# Patient Record
Sex: Female | Born: 2000 | Race: White | Hispanic: No | Marital: Single | Smoking: Never smoker
Health system: Southern US, Community
[De-identification: ages and names within clinical notes are randomized; demographics above are authoritative.]

## PROBLEM LIST (undated history)

## (undated) HISTORY — PX: OTHER SURGICAL HISTORY: SHX169

---
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
## 2022-01-19 HISTORY — PX: KNEE CARTILAGE SURGERY: SHX688

## 2022-06-11 ENCOUNTER — Ambulatory Visit: Payer: BC Managed Care – PPO | Admitting: Family Medicine

## 2022-06-11 ENCOUNTER — Encounter: Payer: Self-pay | Admitting: Family Medicine

## 2022-06-11 VITALS — BP 102/60 | HR 78 | Temp 98.0°F | Resp 18 | Ht 67.5 in | Wt 133.2 lb

## 2022-06-11 DIAGNOSIS — J060 Acute laryngopharyngitis: Secondary | ICD-10-CM

## 2022-06-11 DIAGNOSIS — J029 Acute pharyngitis, unspecified: Secondary | ICD-10-CM

## 2022-06-11 DIAGNOSIS — R509 Fever, unspecified: Secondary | ICD-10-CM | POA: Diagnosis not present

## 2022-06-11 LAB — POCT RAPID STREP A (OFFICE): Rapid Strep A Screen: NEGATIVE

## 2022-06-11 MED ORDER — AZITHROMYCIN 250 MG PO TABS: ORAL_TABLET | ORAL | 0 refills | Status: DC

## 2022-06-11 NOTE — Progress Notes (Signed)
Acute Office Visit  Subjective:    Patient ID: Teresa Humphrey, female    DOB: Nov 03, 2001, 21 y.o.   MRN: [id number]  Chief Complaint  Patient presents with   Sore Throat   Generalized Body Aches    HPI: Patient is in today for fever (low 100s) and sore throat x 3-4 days. No nasal congestion or cough.   History reviewed. No pertinent past medical history.  Past Surgical History:  Procedure Laterality Date   none      History reviewed. No pertinent family history.  Social History   Socioeconomic History   Marital status: Single    Spouse name: Not on file   Number of children: Not on file   Years of education: Not on file   Highest education level: Not on file  Occupational History   Occupation: student  Tobacco Use   Smoking status: Never   Smokeless tobacco: Never  Vaping Use   Vaping Use: Never used  Substance and Sexual Activity   Alcohol use: Never   Drug use: Never   Sexual activity: Not on file  Other Topics Concern   Not on file  Social History Narrative   Not on file   Social Determinants of Health   Financial Resource Strain: Not on file  Food Insecurity: Not on file  Transportation Needs: Not on file  Physical Activity: Not on file  Stress: Not on file  Social Connections: Not on file  Intimate Partner Violence: Not on file    No outpatient medications prior to visit.   No facility-administered medications prior to visit.    No Known Allergies  Review of Systems  Constitutional:  Positive for fatigue and fever.  HENT:  Positive for sore throat.   Neurological:  Negative for headaches.       Objective:    Physical Exam Vitals reviewed.  Constitutional:      Appearance: Normal appearance. She is well-developed.  HENT:     Right Ear: Tympanic membrane, ear canal and external ear normal.     Left Ear: Tympanic membrane, ear canal and external ear normal.     Nose: Nose normal.     Mouth/Throat:     Pharynx: Posterior  oropharyngeal erythema present. No oropharyngeal exudate.  Cardiovascular:     Rate and Rhythm: Normal rate and regular rhythm.     Heart sounds: Normal heart sounds. No murmur heard. Pulmonary:     Effort: Pulmonary effort is normal. No respiratory distress.     Breath sounds: Normal breath sounds.  Lymphadenopathy:     Cervical: Cervical adenopathy present.  Neurological:     Mental Status: She is alert and oriented to person, place, and time.  Psychiatric:        Mood and Affect: Mood normal.        Behavior: Behavior normal.     BP 102/60   Pulse 78   Temp 98 F (36.7 C)   Resp 18   Ht 5' 7.5" (1.715 m)   Wt 133 lb 3.2 oz (60.4 kg)   BMI 20.55 kg/m  Wt Readings from Last 3 Encounters:  06/11/22 133 lb 3.2 oz (60.4 kg) (60 %, Z= 0.26)*  04/25/21 129 lb (58.5 kg) (58 %, Z= 0.21)*  12/08/20 126 lb 9.6 oz (57.4 kg) (55 %, Z= 0.14)*   * Growth percentiles are based on CDC (Girls, 2-20 Years) data.    Health Maintenance Due  Topic Date Due   HPV VACCINES (1 -  2-dose series) Never done   COVID-19 Vaccine (3 - Pfizer series) 06/15/2020       Topic Date Due   HPV VACCINES (1 - 2-dose series) Never done     Lab Results  Component Value Date   TSH 0.825 12/08/2020   Lab Results  Component Value Date   WBC 11.3 (H) 12/08/2020   HGB 14.0 12/08/2020   HCT 41.0 12/08/2020   MCV 87 12/08/2020   PLT 300 12/08/2020   Lab Results  Component Value Date   NA 139 12/08/2020   K 4.9 12/08/2020   CO2 25 12/08/2020   GLUCOSE 87 12/08/2020   BUN 13 12/08/2020   CREATININE 0.84 12/08/2020   BILITOT <0.2 12/08/2020   ALKPHOS 73 12/08/2020   AST 19 12/08/2020   ALT 16 12/08/2020   PROT 7.4 12/08/2020   ALBUMIN 4.7 12/08/2020   CALCIUM 9.9 12/08/2020   ANIONGAP 11 09/27/2020   No results found for: "CHOL" No results found for: "HDL" No results found for: "LDLCALC" No results found for: "TRIG" No results found for: "CHOLHDL" No results found for: "HGBA1C"      Assessment & Plan:   Problem List Items Addressed This Visit       Respiratory   Acute laryngopharyngitis    Zpack.  Gargle warm salt water.  Check for mono.         Other   Sore throat - Primary   Relevant Orders   POCT rapid strep A (Completed)   Mononucleosis screen   Fever   Relevant Orders   POCT rapid strep A (Completed)   Meds ordered this encounter  Medications   azithromycin (ZITHROMAX) 250 MG tablet    Sig: 2 DAILY FOR FIRST DAY, THEN DECREASE TO ONE DAILY FOR 4 MORE DAYS.    Dispense:  6 tablet    Refill:  0    Orders Placed This Encounter  Procedures   Mononucleosis screen   POCT rapid strep A     Follow-up: No follow-ups on file.  An After Visit Summary was printed and given to the patient.  Blane Ohara, MD [facility] [phone]

## 2022-06-11 NOTE — Assessment & Plan Note (Signed)
Zpack.  Gargle warm salt water.  Check for mono.

## 2022-06-12 LAB — MONONUCLEOSIS SCREEN: Mono Screen: NEGATIVE

## 2022-06-15 ENCOUNTER — Telehealth: Payer: Self-pay

## 2022-06-15 ENCOUNTER — Other Ambulatory Visit: Payer: Self-pay | Admitting: Family Medicine

## 2022-06-15 MED ORDER — AMOXICILLIN-POT CLAVULANATE 875-125 MG PO TABS: 1.0000 | ORAL_TABLET | Freq: Two times a day (BID) | ORAL | 0 refills | Status: DC

## 2022-06-15 NOTE — Telephone Encounter (Signed)
Mother made aware.   Lorita Officer, CCMA 06/15/22 10:41 AM

## 2022-06-15 NOTE — Telephone Encounter (Signed)
Mother calling as patient was seen on Monday. She was treated for sore throat. Mother is concerned as today is last day of zpack and patient's throat is not better and still running low grade fever. Patient has been doing salt water gargles "some" and taking tylenol for fever. Fever has been 99-100 when tylenol wears off. Mother is questioning if she needs more antibiotic. Patient is scheduled to have wisdom teeth removed on Wednesday.  Please advise.  Steward Drone: [phone] Lorita Officer, CCMA 06/15/22 8:09 AM

## 2022-06-18 ENCOUNTER — Telehealth: Payer: Self-pay

## 2022-06-18 ENCOUNTER — Other Ambulatory Visit: Payer: Self-pay | Admitting: Family Medicine

## 2022-06-18 DIAGNOSIS — Z130 Encounter for screening for diseases of the blood and blood-forming organs and certain disorders involving the immune mechanism: Secondary | ICD-10-CM

## 2022-06-18 NOTE — Telephone Encounter (Signed)
Steward Drone called to report that Soma needs a sickle cell test to meet [employer] requirements for her physical.  Dr. Sedalia Muta approved that order and she is coming in tomorrow afternoon.

## 2022-06-19 ENCOUNTER — Other Ambulatory Visit: Payer: Self-pay | Admitting: Family Medicine

## 2022-06-19 ENCOUNTER — Ambulatory Visit: Payer: BC Managed Care – PPO

## 2022-06-19 DIAGNOSIS — Z130 Encounter for screening for diseases of the blood and blood-forming organs and certain disorders involving the immune mechanism: Secondary | ICD-10-CM

## 2022-06-20 LAB — HGB SOLU + RFLX FRAC: Sickle Solubility Test - HGBRFX: NEGATIVE

## 2022-10-31 IMAGING — US US ABDOMEN LIMITED
1 series · 10 of 10 positions shown · non-contrast
Comparison: None.

CLINICAL DATA: Generalized lower abdominal pain.

EXAM:
ULTRASOUND ABDOMEN LIMITED
TECHNIQUE: Gray scale imaging of the right lower quadrant was performed to
evaluate for suspected appendicitis. Standard imaging planes and
graded compression technique were utilized.

[Series 1: us appendix (abdomen limited) · 10 acquisitions, 10 frames shown]
[im 1/10]
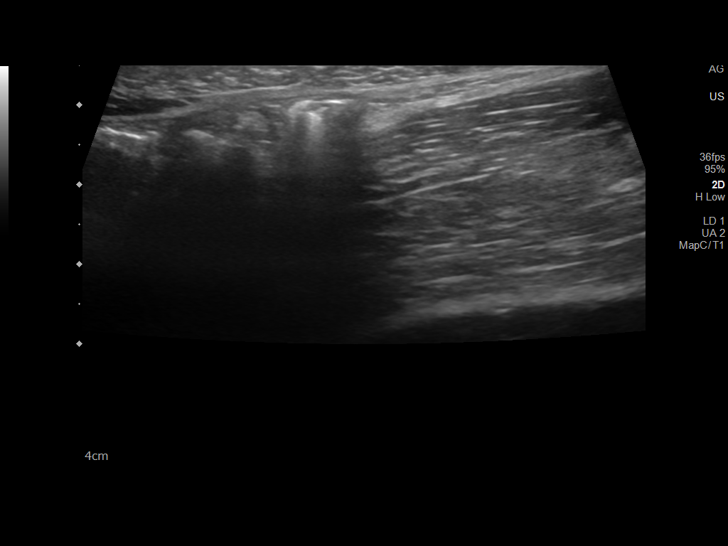
[im 2/10]
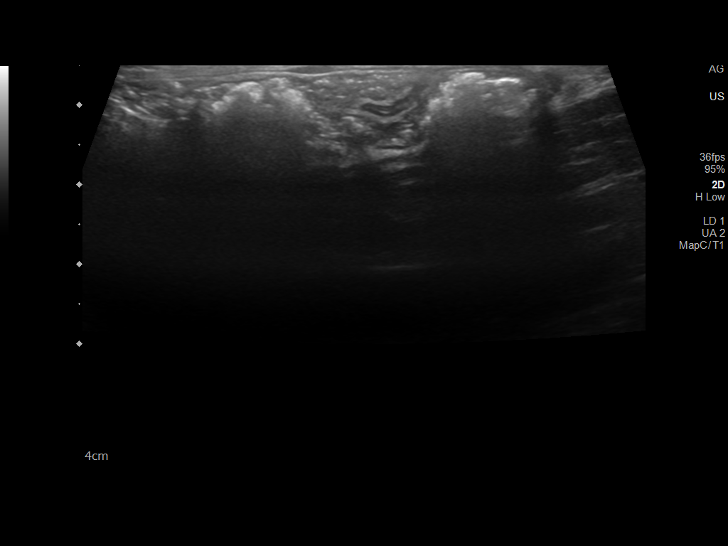
[im 3/10]
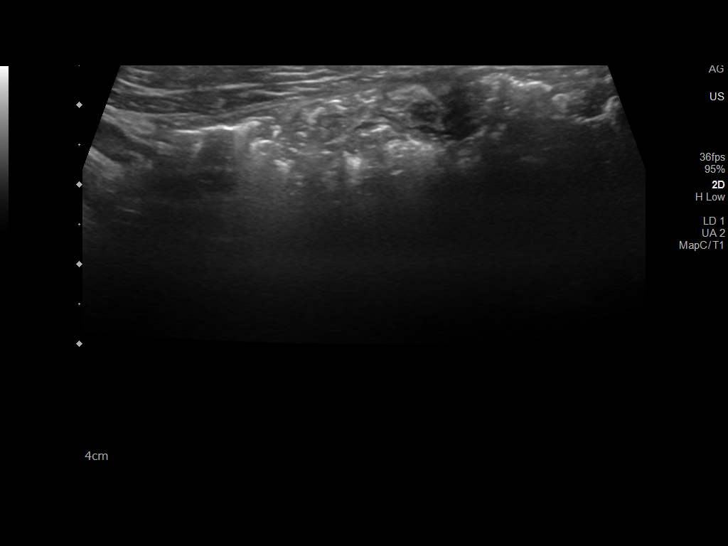
[im 4/10]
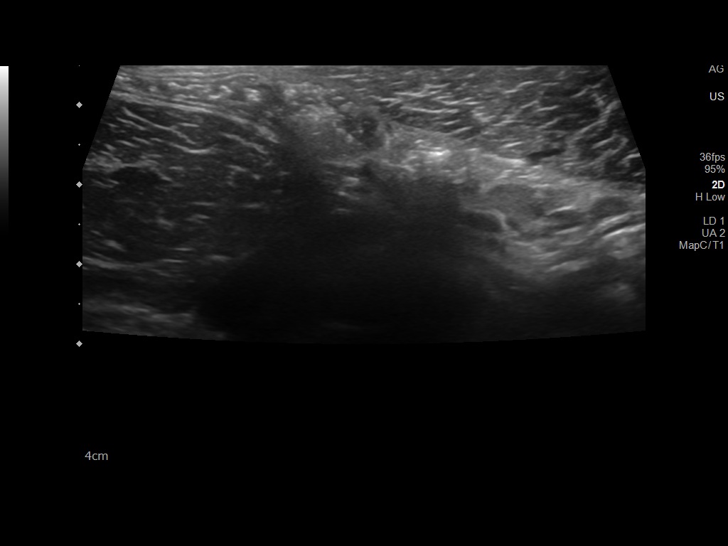
[im 5/10]
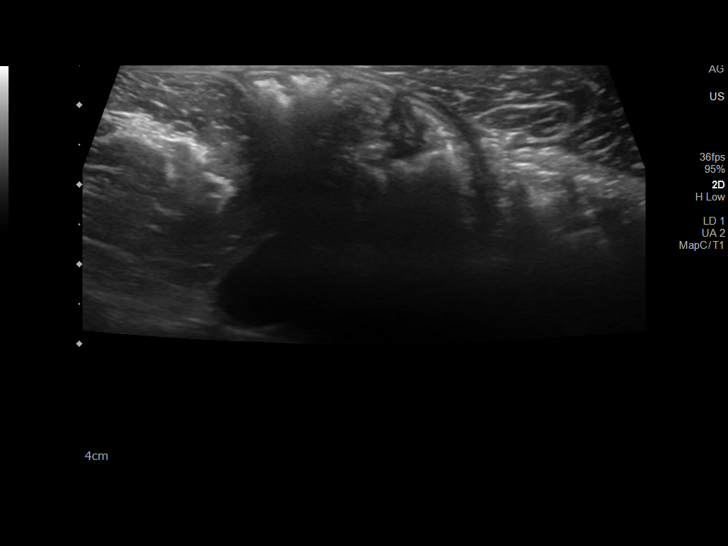
[im 6/10]
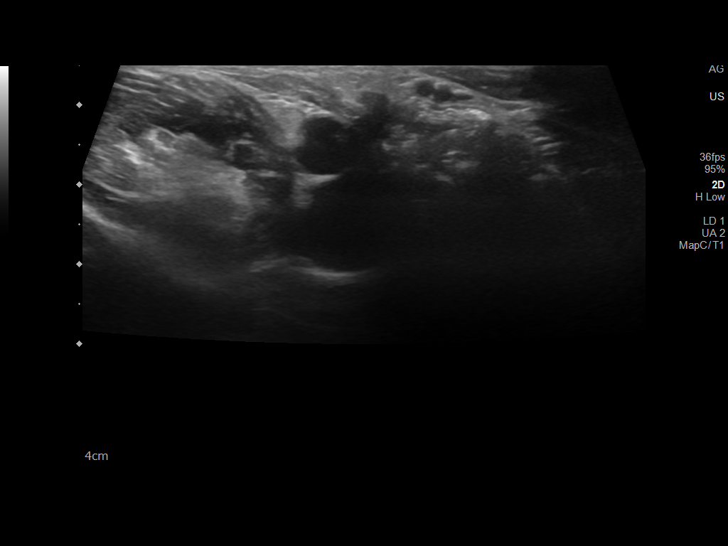
[im 7/10]
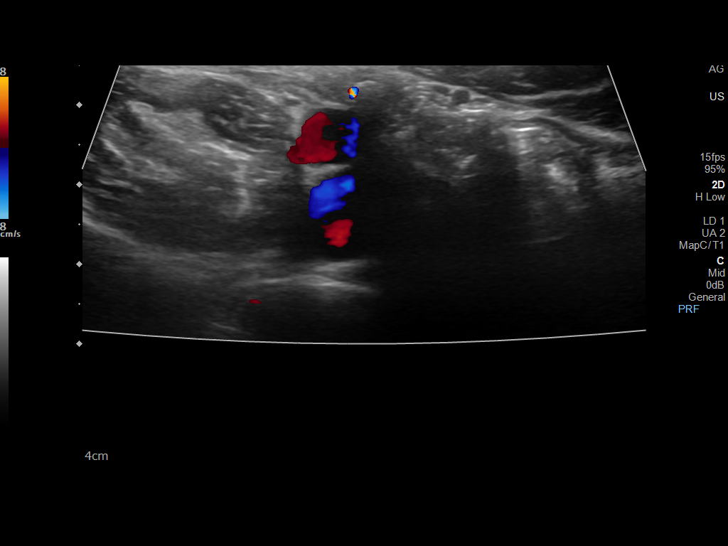
[im 8/10]
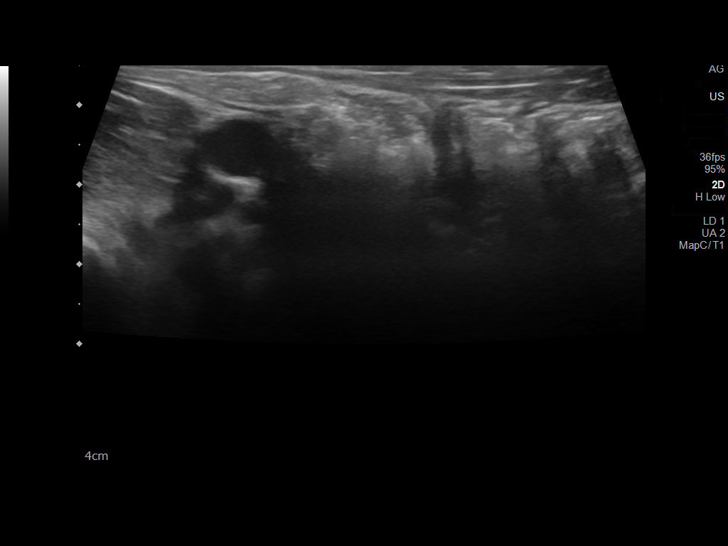
[im 9/10]
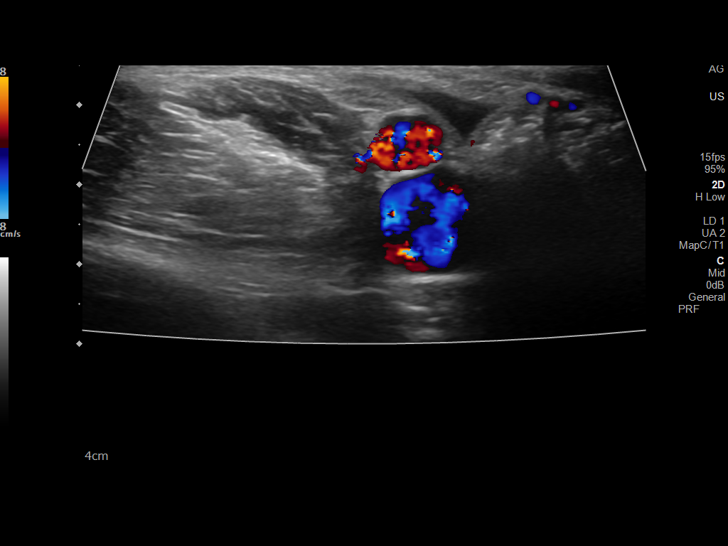
[im 10/10]
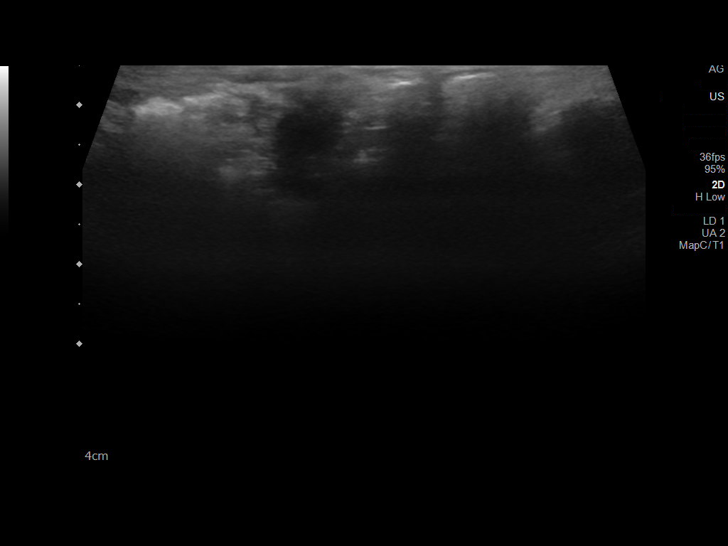

[10 of 10 positions shown; findings below may reference images not displayed]

FINDINGS: The appendix is not visualized.

Ancillary findings: None.

Factors affecting image quality: None.

Other findings: Small amount of free fluid in the pelvis.
IMPRESSION: Appendix not visualized.

Small amount of free fluid in the right pelvis

## 2022-10-31 IMAGING — US US ART/VEN ABD/PELV/SCROTUM DOPPLER LTD
1 series · 14 of 25 positions shown · non-contrast
Comparison: None.

CLINICAL DATA: 19-year-old female with right lower quadrant
abdominal pain.

EXAM:
TRANSABDOMINAL ULTRASOUND OF PELVIS
DOPPLER ULTRASOUND OF OVARIES
TECHNIQUE: Transabdominal ultrasound examination of the pelvis was performed
including evaluation of the uterus, ovaries, adnexal regions, and
pelvic cul-de-sac.
Color and duplex Doppler ultrasound was utilized to evaluate blood
flow to the ovaries.

[Series 1: us pelvic doppler (torsion right/o or mass arteria · arterial · 14 of 38 slices shown]
[im 1/38]
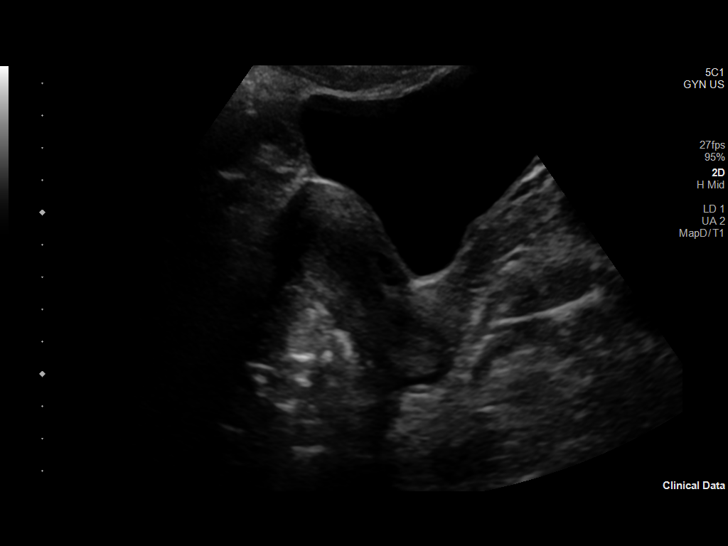
[im 4/38]
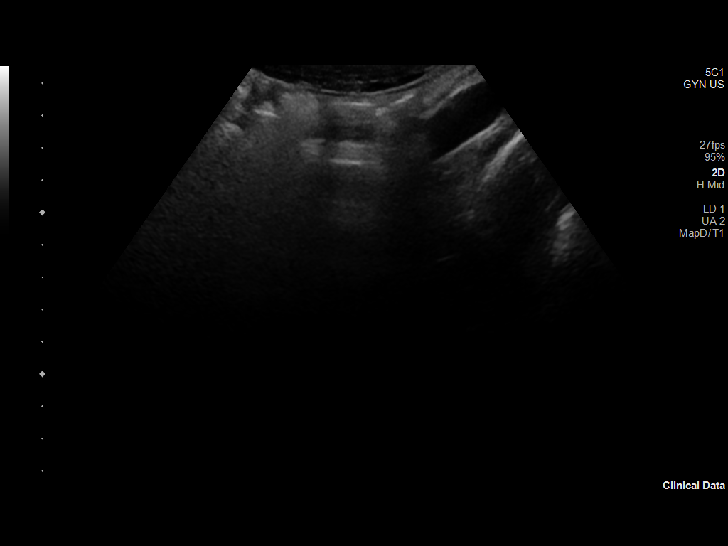
[im 7/38]
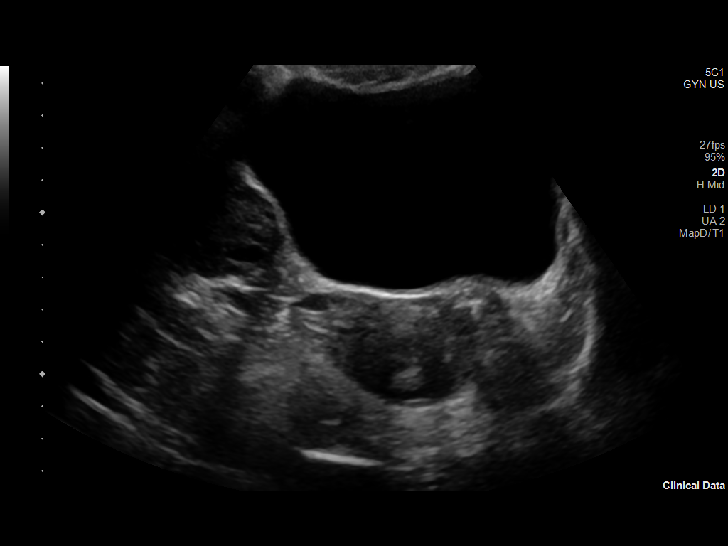
[im 10/38]
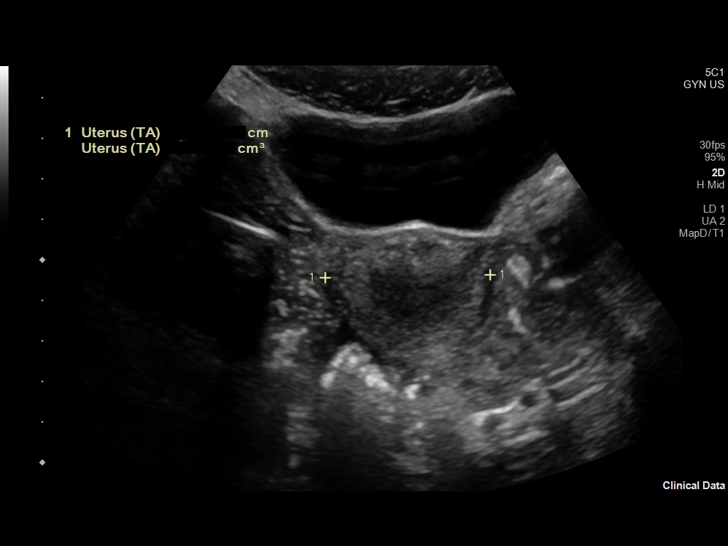
[im 13/38]
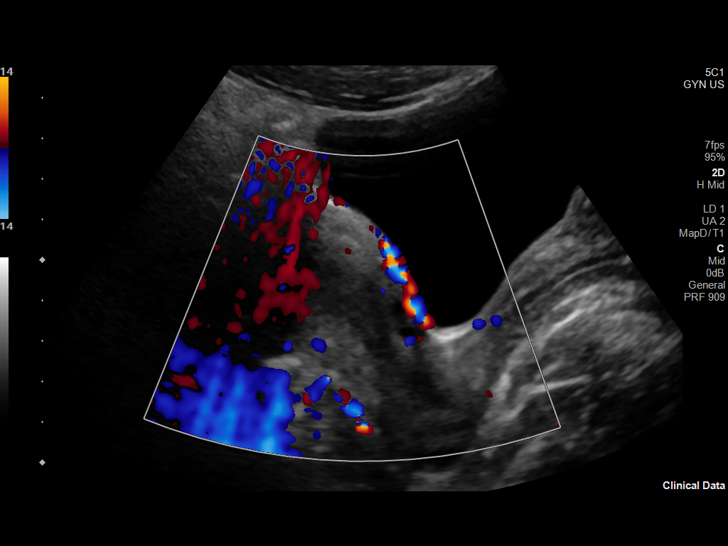
[im 14/38]
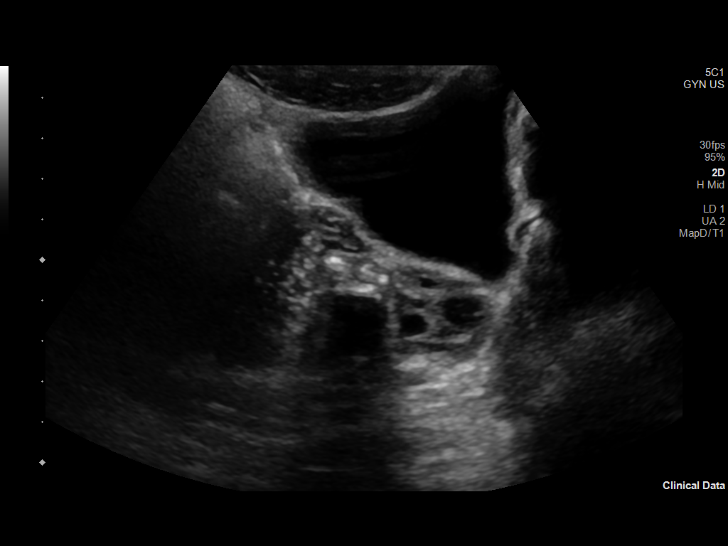
[im 17/38]
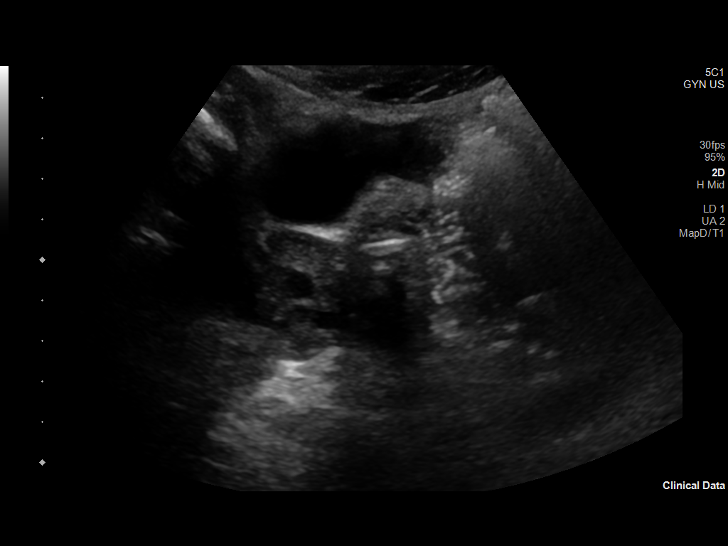
[im 21/38]
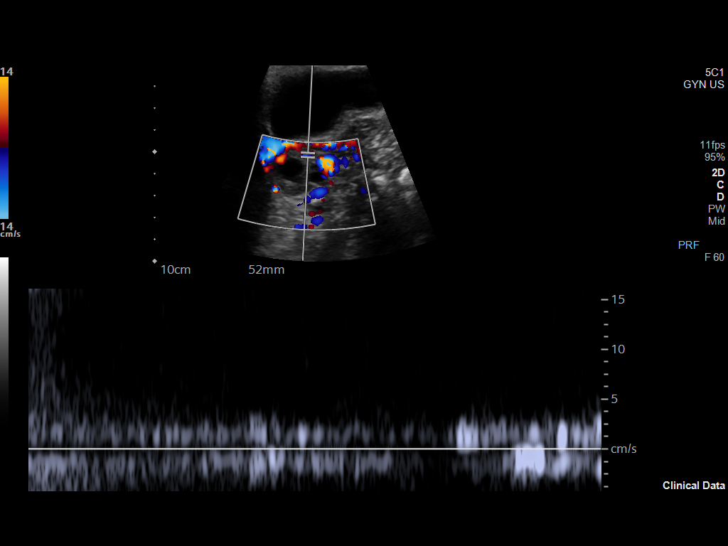
[im 24/38]
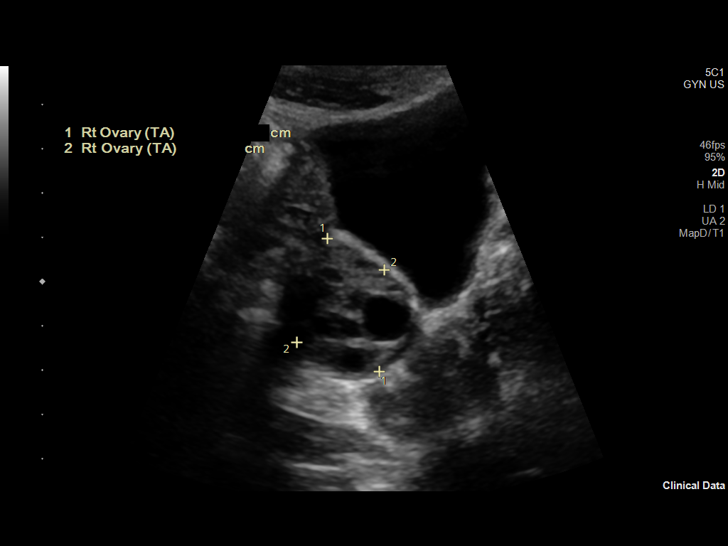
[im 25/38]
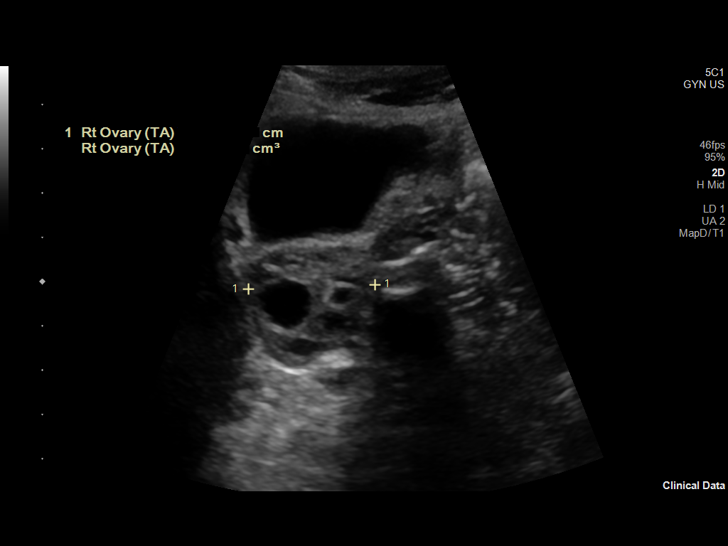
[im 28/38]
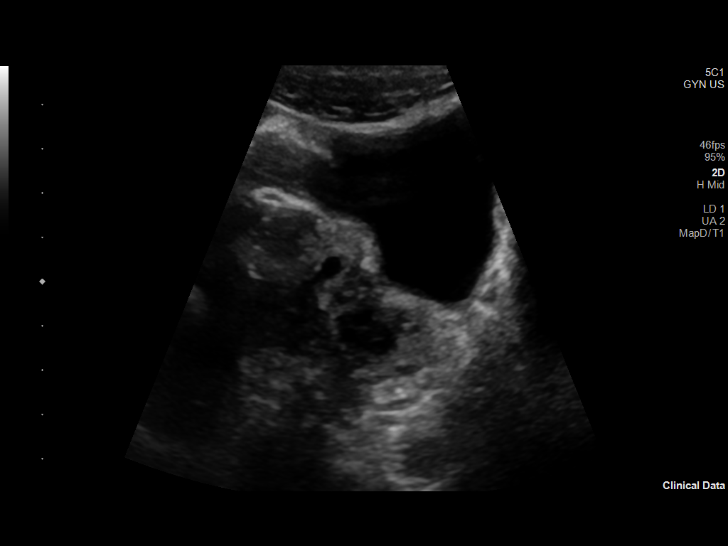
[im 31/38]
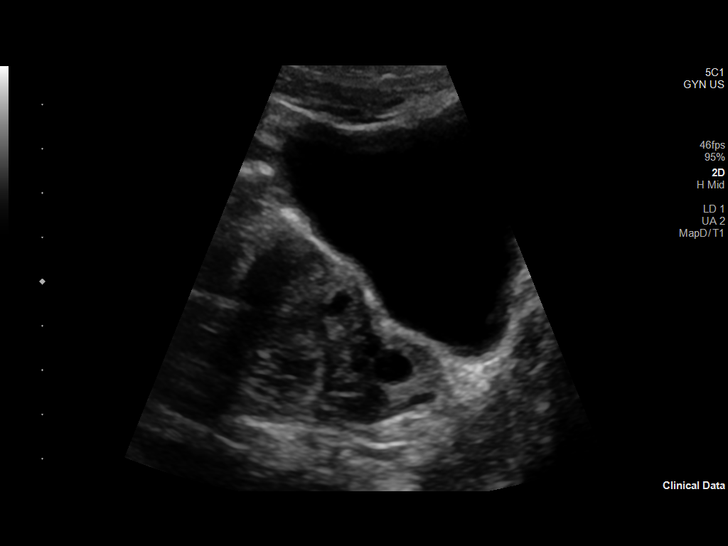
[im 34/38]
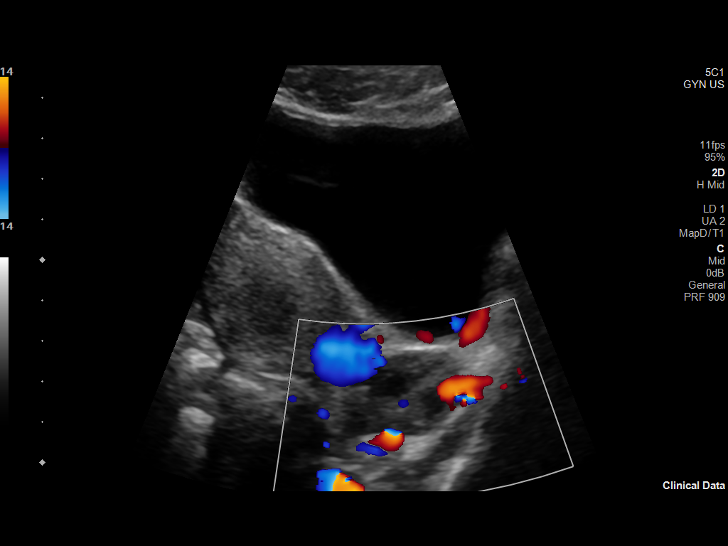
[im 38/38]
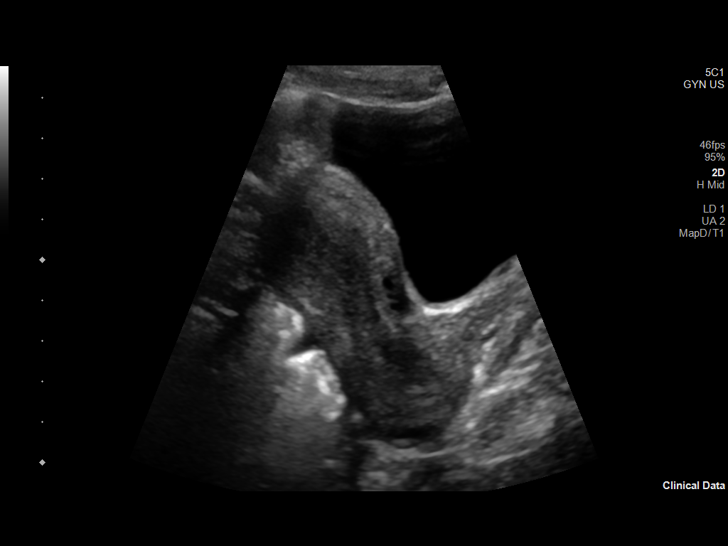

[14 of 25 positions shown; findings below may reference images not displayed]

FINDINGS: Uterus

Measurements: 7.4 x 3.3 x 4.1 cm = volume: 5.2 mL. No fibroids or
other mass visualized.

Endometrium

Thickness: 4 mm.  No focal abnormality visualized.

Right ovary

Measurements: 3.2 x 2.6 x 2.9 cm = volume: 12.4 mL. Normal
appearance/no adnexal mass.

Left ovary

Measurements: 3.1 x 2.3 x 2.3 cm = volume: 8.2 mL. Normal
appearance/no adnexal mass.

Pulsed Doppler evaluation demonstrates normal low-resistance
arterial and venous waveforms in both ovaries.

Other: None
IMPRESSION: Unremarkable pelvic ultrasound.

## 2023-01-21 ENCOUNTER — Ambulatory Visit: Payer: BC Managed Care – PPO | Admitting: Physician Assistant

## 2023-01-22 ENCOUNTER — Encounter: Payer: BC Managed Care – PPO | Admitting: Physician Assistant

## 2023-04-15 ENCOUNTER — Telehealth: Payer: Self-pay

## 2023-04-15 NOTE — Telephone Encounter (Signed)
PATIENT'S MOTHER CALLED STATING THAT THE PATIENT HAS BEEN RUNNING A LOW GRADE FEVER IN THE EVENINGS FOR A WEEK AND THEN ALSO FEELING ACHY ALL OVER BUT ONLY DURING THE EVENINGS. CONSULTED WITH PATIENT'S PROVIDER SARA DAVIS AND PER PROVIDER PATIENT WOULD NEED TO GO TO URGENT CARE BECAUSE SHE HAS NO OPENINGS THE WHOLE WEEK. PATIENT'S MOTHER INFORMED.

## 2023-05-23 ENCOUNTER — Telehealth: Payer: Self-pay

## 2023-05-23 NOTE — Telephone Encounter (Signed)
LVM for patient to call back [phone], or to call PCP office to schedule physical apt. AS, CMA  

## 2024-01-20 ENCOUNTER — Ambulatory Visit (INDEPENDENT_AMBULATORY_CARE_PROVIDER_SITE_OTHER): Payer: Self-pay | Admitting: Family Medicine

## 2024-01-20 ENCOUNTER — Encounter: Payer: Self-pay | Admitting: Family Medicine

## 2024-01-20 VITALS — BP 100/62 | HR 64 | Temp 98.3°F | Resp 16 | Ht 69.0 in | Wt 141.0 lb

## 2024-01-20 DIAGNOSIS — Z0001 Encounter for general adult medical examination with abnormal findings: Secondary | ICD-10-CM

## 2024-01-20 DIAGNOSIS — G43009 Migraine without aura, not intractable, without status migrainosus: Secondary | ICD-10-CM | POA: Insufficient documentation

## 2024-01-20 DIAGNOSIS — F4323 Adjustment disorder with mixed anxiety and depressed mood: Secondary | ICD-10-CM | POA: Insufficient documentation

## 2024-01-20 DIAGNOSIS — S83262A Peripheral tear of lateral meniscus, current injury, left knee, initial encounter: Secondary | ICD-10-CM | POA: Insufficient documentation

## 2024-01-20 MED ORDER — NURTEC 75 MG PO TBDP: ORAL_TABLET | ORAL | 2 refills | Status: DC

## 2024-01-20 NOTE — Progress Notes (Signed)
 Subjective:  Patient ID: Teresa Humphrey, female    DOB: 10-17-2001  Age: 23 y.o. MRN: [id number]  Chief Complaint  Patient presents with   Annual Exam    Discussed the use of AI scribe software for clinical note transcription with the patient, who gave verbal consent to proceed.   Well Adult Physical: Patient here for a comprehensive physical exam.The patient reports problems - Migraines  Patient states she gets migraines two or three times a month. She stated they last anywhere from 12 hours to a week.  Do you take any herbs or supplements that were not prescribed by a doctor? no Are you taking calcium supplements? no Are you taking aspirin daily? no  Encounter for general adult medical examination without abnormal findings  Physical ("At Risk" items are starred): Patient's last physical exam was 1 year ago .  Patient is not afflicted from Stress Incontinence and Urge Incontinence  Patient wears a seat belts Patient has smoke detectors and has carbon monoxide detectors. Patient practices appropriate gun safety. Patient wears sunscreen with extended sun exposure. Dental Care: biannual cleanings, brushes and flosses daily. Ophthalmology/Optometry: Annual visit.  Hearing loss: none Vision impairments: none  Menarche: 15 Menstrual History: normal LMP: 01/01/2024 Pregnancy history: 0 Safe at home: yes Self breast exams: no  Discussed the use of AI scribe software for clinical note transcription with the patient, who gave verbal consent to proceed.   HPI: The patient presents with headaches.  She experiences headaches primarily on the left side, behind her eye, which have worsened since starting college. The headaches typically begin in the morning and intensify by 2 PM, occurring four to five times a month and sometimes lasting up to a week. The frequency has increased from once a month last year. She describes a sensation of tightness behind her eyes as a precursor to the  headaches and notes sensitivity to light and noise during these episodes. No numbness or tingling is associated with the headaches.  She has tried ibuprofen and Excedrin for relief, but finds Excedrin's caffeine content disruptive to her sleep.    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 Physical activity does not typically worsen her headaches. She maintains hydration and monitors her diet to manage symptoms.  She has not had blood work since January 2022 and is interested in having it done to rule out issues like blood sugar imbalances. She fasted prior to the visit in preparation for potential lab work.     01/20/2024   10:03 AM 04/25/2021    2:09 PM  Depression screen PHQ 2/9  Decreased Interest 0 0  Down, Depressed, Hopeless 0 0  PHQ - 2 Score 0 0  Altered sleeping 2   Tired, decreased energy 2   Change in appetite 0   Feeling bad or failure about yourself  0   Trouble concentrating 3   Moving slowly or fidgety/restless 0   Suicidal thoughts 0   PHQ-9 Score 7   Difficult doing work/chores Very difficult          01/20/2024   10:03 AM  Fall Risk  Falls in the past year? 0  Was there an injury with Fall? 0  Fall Risk Category Calculator 0  Patient at Risk for Falls Due to No Fall Risks  Fall risk Follow up Falls evaluation completed        Social Hx   Social History   Socioeconomic History  Marital status: Single    Spouse name: Not on file   Number of children: Not on file   Years of education: Not on file   Highest education level: Not on file  Occupational History   Occupation: student  Tobacco Use   Smoking status: Never   Smokeless tobacco: Never  Vaping Use   Vaping status: Never Used  Substance and Sexual Activity   Alcohol use: Never   Drug use: Never   Sexual activity: Not on file  Other Topics Concern   Not on file  Social History Narrative   Not on file   Social Drivers of Health    Financial Resource Strain: Not on file  Food Insecurity: Not on file  Transportation Needs: Not on file  Physical Activity: Not on file  Stress: Not on file  Social Connections: Not on file   History reviewed. No pertinent past medical history. Past Surgical History:  Procedure Laterality Date   KNEE CARTILAGE SURGERY  01/19/2022   none      History reviewed. No pertinent family history.  Review of Systems  Constitutional:  Positive for fatigue. Negative for chills, diaphoresis and fever.  HENT:  Negative for congestion, ear pain, sinus pressure, sinus pain and sneezing.   Eyes: Negative.   Respiratory:  Negative for cough, chest tightness, shortness of breath and wheezing.   Cardiovascular:  Negative for chest pain and palpitations.  Gastrointestinal:  Negative for abdominal pain, constipation, diarrhea, nausea and vomiting.  Endocrine: Negative.   Genitourinary:  Negative for dysuria.  Musculoskeletal:  Negative for arthralgias.  Skin: Negative.   Neurological:  Positive for dizziness, light-headedness and headaches. Negative for weakness.  Hematological: Negative.   Psychiatric/Behavioral:  Negative for dysphoric mood. The patient is not nervous/anxious.      Objective:  BP 100/62   Pulse 64   Temp 98.3 F (36.8 C) (Oral)   Resp 16   Ht 5\' 9"  (1.753 m)   Wt 141 lb (64 kg)   LMP 01/01/2024   SpO2 99%   BMI 20.82 kg/m      01/20/2024    9:54 AM 06/11/2022   11:10 AM 04/25/2021    2:08 PM  BP/Weight  Systolic BP 100 102 108  Diastolic BP 62 60 66  Wt. (Lbs) 141 133.2 129  BMI 20.82 kg/m2 20.55 kg/m2 19.47 kg/m2    Physical Exam Vitals reviewed.  Constitutional:      General: She is not in acute distress.    Appearance: Normal appearance. She is well-groomed and normal weight. She is not ill-appearing.  HENT:     Head: Normocephalic.     Right Ear: Tympanic membrane, ear canal and external ear normal.     Left Ear: Tympanic membrane, ear canal and  external ear normal.     Nose: Nose normal.     Mouth/Throat:     Mouth: Mucous membranes are moist.     Pharynx: Oropharynx is clear. No posterior oropharyngeal erythema.  Eyes:     Extraocular Movements: Extraocular movements intact.     Conjunctiva/sclera: Conjunctivae normal.     Pupils: Pupils are equal, round, and reactive to light.  Cardiovascular:     Rate and Rhythm: Normal rate and regular rhythm.     Pulses: Normal pulses.     Heart sounds: Normal heart sounds. No murmur heard. Pulmonary:     Effort: Pulmonary effort is normal.     Breath sounds: Normal breath sounds. No wheezing.  Abdominal:  General: Bowel sounds are normal.     Palpations: Abdomen is soft.     Tenderness: There is no abdominal tenderness.  Musculoskeletal:        General: Normal range of motion.     Cervical back: Normal range of motion and neck supple.  Lymphadenopathy:     Cervical: No cervical adenopathy.  Skin:    General: Skin is warm and dry.  Neurological:     Mental Status: She is alert and oriented to person, place, and time. Mental status is at baseline.     Cranial Nerves: Cranial nerves 2-12 are intact.     Sensory: Sensation is intact.     Motor: No weakness.     Coordination: Coordination is intact.     Gait: Gait is intact.     Deep Tendon Reflexes: Reflexes are normal and symmetric.  Psychiatric:        Attention and Perception: Attention and perception normal.        Mood and Affect: Mood normal.        Speech: Speech normal.        Behavior: Behavior normal.        Thought Content: Thought content normal.        Judgment: Judgment normal.     Lab Results  Component Value Date   WBC 11.3 (H) 12/08/2020   HGB 14.0 12/08/2020   HCT 41.0 12/08/2020   PLT 300 12/08/2020   GLUCOSE 87 12/08/2020   ALT 16 12/08/2020   AST 19 12/08/2020   NA 139 12/08/2020   K 4.9 12/08/2020   CL 103 12/08/2020   CREATININE 0.84 12/08/2020   BUN 13 12/08/2020   CO2 25 12/08/2020    TSH 0.825 12/08/2020      Assessment & Plan:   Encounter for routine adult health examination with abnormal findings -     CBC with Differential/Platelet -     CMP14+EGFR -     Lipid panel -     TSH  Migraine without aura and without status migrainosus, not intractable Assessment & Plan: Increasing frequency of migraines, occurring 4-5 times per month, predominantly on the left side behind the eye. No associated numbness or tingling. Sensitivity to light and noise during episodes. Previous trial of ibuprofen and Excedrin with limited success. -Provide samples of Ubrelvy and Nurtec for trial. -Order blood work to rule out other potential causes of headaches. -Follow-up in a few days with lab results and to assess response to new medications.  Orders: -     Nurtec; One 75 mg tablet in 24 hours as needed for migraine  Dispense: 8 tablet; Refill: 2     Body mass index is 20.82 kg/m.   These are the goals we discussed:  Goals      Have 3 meals a day     - Continue to eat a healthy diet and exercise          This is a list of the screening recommended for you and due dates:  Health Maintenance  Topic Date Due   HPV Vaccine (1 - 3-dose series) Never done   Pap Smear  Never done   COVID-19 Vaccine (3 - 2024-25 season) 10/26/2024*   DTaP/Tdap/Td vaccine (2 - Td or Tdap) 01/16/2025   Flu Shot  Completed   Hepatitis C Screening  Discontinued   HIV Screening  Discontinued  *Topic was postponed. The date shown is not the original due date.  Meds ordered this encounter  Medications   Rimegepant Sulfate (NURTEC) 75 MG TBDP    Sig: One 75 mg tablet in 24 hours as needed for migraine    Dispense:  8 tablet    Refill:  2    Follow-up: No follow-ups on file.  An After Visit Summary was printed and given to the patient.  Lajuana Matte, FNP [facility] [phone]

## 2024-01-20 NOTE — Assessment & Plan Note (Signed)
 Increasing frequency of migraines, occurring 4-5 times per month, predominantly on the left side behind the eye. No associated numbness or tingling. Sensitivity to light and noise during episodes. Previous trial of ibuprofen and Excedrin with limited success. -Provide samples of Ubrelvy and Nurtec for trial. -Order blood work to rule out other potential causes of headaches. -Follow-up in a few days with lab results and to assess response to new medications.

## 2024-01-21 LAB — CBC WITH DIFFERENTIAL/PLATELET
Basophils Absolute: 0.1 10*3/uL (ref 0.0–0.2)
Basos: 0 %
EOS (ABSOLUTE): 0.2 10*3/uL (ref 0.0–0.4)
Eos: 1 %
Hematocrit: 42.6 % (ref 34.0–46.6)
Hemoglobin: 14.1 g/dL (ref 11.1–15.9)
Immature Grans (Abs): 0 10*3/uL (ref 0.0–0.1)
Immature Granulocytes: 0 %
Lymphocytes Absolute: 2.7 10*3/uL (ref 0.7–3.1)
Lymphs: 22 %
MCH: 30.1 pg (ref 26.6–33.0)
MCHC: 33.1 g/dL (ref 31.5–35.7)
MCV: 91 fL (ref 79–97)
Monocytes Absolute: 0.7 10*3/uL (ref 0.1–0.9)
Monocytes: 6 %
Neutrophils Absolute: 8.6 10*3/uL — ABNORMAL HIGH (ref 1.4–7.0)
Neutrophils: 71 %
Platelets: 329 10*3/uL (ref 150–450)
RBC: 4.68 x10E6/uL (ref 3.77–5.28)
RDW: 12.7 % (ref 11.7–15.4)
WBC: 12.3 10*3/uL — ABNORMAL HIGH (ref 3.4–10.8)

## 2024-01-21 LAB — LIPID PANEL
Chol/HDL Ratio: 2.3 ratio (ref 0.0–4.4)
Cholesterol, Total: 156 mg/dL (ref 100–199)
HDL: 67 mg/dL (ref >39)
LDL Chol Calc (NIH): 78 mg/dL (ref 0–99)
Triglycerides: 54 mg/dL (ref 0–149)
VLDL Cholesterol Cal: 11 mg/dL (ref 5–40)

## 2024-01-21 LAB — CMP14+EGFR
ALT: 16 IU/L (ref 0–32)
AST: 20 IU/L (ref 0–40)
Albumin: 4.6 g/dL (ref 4.0–5.0)
Alkaline Phosphatase: 66 IU/L (ref 44–121)
BUN/Creatinine Ratio: 16 (ref 9–23)
BUN: 15 mg/dL (ref 6–20)
Bilirubin Total: 0.2 mg/dL (ref 0.0–1.2)
CO2: 22 mmol/L (ref 20–29)
Calcium: 9.9 mg/dL (ref 8.7–10.2)
Chloride: 103 mmol/L (ref 96–106)
Creatinine, Ser: 0.95 mg/dL (ref 0.57–1.00)
Globulin, Total: 2.7 g/dL (ref 1.5–4.5)
Glucose: 86 mg/dL (ref 70–99)
Potassium: 4.5 mmol/L (ref 3.5–5.2)
Sodium: 138 mmol/L (ref 134–144)
Total Protein: 7.3 g/dL (ref 6.0–8.5)
eGFR: 87 mL/min/{1.73_m2} (ref >59)

## 2024-01-21 LAB — TSH: TSH: 0.925 u[IU]/mL (ref 0.450–4.500)

## 2024-03-03 ENCOUNTER — Telehealth: Payer: Self-pay | Admitting: Physician Assistant

## 2024-03-03 ENCOUNTER — Other Ambulatory Visit: Payer: Self-pay | Admitting: Family Medicine

## 2024-03-03 DIAGNOSIS — G43009 Migraine without aura, not intractable, without status migrainosus: Secondary | ICD-10-CM

## 2024-03-03 MED ORDER — NURTEC 75 MG PO TBDP: ORAL_TABLET | ORAL | 2 refills | Status: DC

## 2024-03-03 NOTE — Telephone Encounter (Signed)
 Prescription Request  03/03/2024  LOV: Visit date not found  What is the name of the medication or equipment? Rimegepant Sulfate (NURTEC) 75 MG TBDP   Have you contacted your pharmacy to request a refill? No   Which pharmacy would you like this sent to? [facility]  [address] [place], [place] [address] [phone] PHONE  Patient notified that their request is being sent to the clinical staff for review and that they should receive a response within 2 business days.   Please advise at [facility] [phone]

## 2024-03-03 NOTE — Telephone Encounter (Signed)
 Sent. Dr. Sedalia Muta

## 2024-06-10 ENCOUNTER — Other Ambulatory Visit: Payer: Self-pay | Admitting: Physician Assistant

## 2024-06-10 DIAGNOSIS — G43009 Migraine without aura, not intractable, without status migrainosus: Secondary | ICD-10-CM

## 2024-06-10 MED ORDER — NURTEC 75 MG PO TBDP: ORAL_TABLET | ORAL | 2 refills | Status: AC

## 2024-06-10 NOTE — Telephone Encounter (Signed)
 Copied from CRM [id number]. Topic: Clinical - Medication Refill >> Jun 10, 2024  3:45 PM Rachelle R wrote: Medication: Rimegepant Sulfate (NURTEC) 75 MG TBDP  Has the patient contacted their pharmacy? Yes, call dr  This is the patient's preferred pharmacy:  [facility] - [place], [place] - [address] [place] [address] Phone: [phone] Fax: [phone]  Is this the correct pharmacy for this prescription? Yes If no, delete pharmacy and type the correct one.   Has the prescription been filled recently? No  Is the patient out of the medication? Yes  Has the patient been seen for an appointment in the last year OR does the patient have an upcoming appointment? Yes  Can we respond through MyChart? Yes  Agent: Please be advised that Rx refills may take up to 3 business days. We ask that you follow-up with your pharmacy.

## 2024-06-23 ENCOUNTER — Telehealth: Payer: Self-pay | Admitting: Family Medicine

## 2024-06-23 NOTE — Telephone Encounter (Unsigned)
 Copied from CRM [id number]. Topic: Appointments - Appointment Scheduling >> Jun 23, 2024  4:17 PM Tiffini S wrote: Patient mother Erminio is calling to schedule an appointment with Ginnie or Dr. Sherre. Do not want to see any of the other providers. Her daughter will be leaving to go back to college on 07/09/24- need a referral for a ADHD clinic in [place], [place]. Patient is asking for a call back at [phone]

## 2024-06-24 NOTE — Telephone Encounter (Signed)
 Yes she can make follow up appt with me --- change PCP back to me

## 2024-06-24 NOTE — Telephone Encounter (Signed)
 Spoke with patient 's mother, patient is coming tomorrow morning at 0900.

## 2024-06-25 ENCOUNTER — Ambulatory Visit: Admitting: Physician Assistant

## 2024-06-25 ENCOUNTER — Encounter: Payer: Self-pay | Admitting: Physician Assistant

## 2024-06-25 VITALS — BP 102/68 | HR 65 | Temp 98.0°F | Resp 16 | Ht 69.0 in | Wt 137.0 lb

## 2024-06-25 DIAGNOSIS — R41840 Attention and concentration deficit: Secondary | ICD-10-CM | POA: Diagnosis not present

## 2024-06-25 NOTE — Progress Notes (Signed)
   Subjective:  Patient ID: Teresa Humphrey, female    DOB: Sep 18, 2001  Age: 23 y.o. MRN: [id number]  Chief Complaint  Patient presents with   ADHD    testing    HPI Pt in today to discuss ADD - she states all through school  she had issues with trouble focusing, forgetting things, not completing tasks, procrastinating, and loses interest easily and distracted.  She was never placed on medication when she was younger and does not want medication at this time.  She would like to see about getting some acomodations to help her in school.  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       06/25/2024    8:46 AM 01/20/2024   10:03 AM 04/25/2021    2:09 PM  Depression screen PHQ 2/9  Decreased Interest 0 0 0  Down, Depressed, Hopeless 0 0 0  PHQ - 2 Score 0 0 0  Altered sleeping  2   Tired, decreased energy  2   Change in appetite  0   Feeling bad or failure about yourself   0   Trouble concentrating  3   Moving slowly or fidgety/restless  0   Suicidal thoughts  0   PHQ-9 Score  7   Difficult doing work/chores  Very difficult         01/20/2024   10:03 AM  Fall Risk  Falls in the past year? 0  Was there an injury with Fall? 0  Fall Risk Category Calculator 0  Patient at Risk for Falls Due to No Fall Risks  Fall risk Follow up Falls evaluation completed     ROS CONSTITUTIONAL: Negative for chills, fatigue, fever,  CARDIOVASCULAR: Negative for chest pain, dizziness, palpitations and pedal edema.  RESPIRATORY: Negative for recent cough and dyspnea.   PSYCHIATRIC: see HPI   Current Outpatient Medications:    Rimegepant Sulfate (NURTEC) 75 MG TBDP, One 75 mg tablet in 24 hours as needed for migraine, Disp: 8 tablet, Rfl: 2  History reviewed. No pertinent past medical history. Objective:  PHYSICAL EXAM:   BP 102/68   Pulse 65   Temp 98 F (36.7 C)   Resp 16   Ht 5' 9 (1.753 m)   Wt 137 lb (62.1 kg)   BMI 20.23 kg/m    GEN:  Well nourished, well developed, in no acute distress   Cardiac: RRR; no murmurs,  Respiratory:  normal respiratory rate and pattern with no distress - normal breath sounds with no rales, rhonchi, wheezes or rubs  Neuro:  Alert and Oriented x 3,- CN II-Xii grossly intact Psych: euthymic mood, appropriate affect and demeanor  Assessment & Plan:    Attention or concentration deficit   Recommend quiet testing room , extra test time, sit in from of classroom  Follow-up: Return if symptoms worsen or fail to improve.  An After Visit Summary was printed and given to the patient.  CAMIE JONELLE NICHOLAUS DEVONNA [facility] [phone]
